# Patient Record
Sex: Male | Born: 1955 | Race: White | Hispanic: No | Marital: Married | Smoking: Former smoker
Health system: Southern US, Community
[De-identification: ages and names within clinical notes are randomized; demographics above are authoritative.]

## PROBLEM LIST (undated history)

## (undated) DIAGNOSIS — R011 Cardiac murmur, unspecified: Secondary | ICD-10-CM

## (undated) DIAGNOSIS — E119 Type 2 diabetes mellitus without complications: Secondary | ICD-10-CM

## (undated) DIAGNOSIS — I219 Acute myocardial infarction, unspecified: Secondary | ICD-10-CM

## (undated) DIAGNOSIS — I519 Heart disease, unspecified: Secondary | ICD-10-CM

## (undated) DIAGNOSIS — I10 Essential (primary) hypertension: Secondary | ICD-10-CM

## (undated) DIAGNOSIS — E785 Hyperlipidemia, unspecified: Secondary | ICD-10-CM

## (undated) HISTORY — DX: Acute myocardial infarction, unspecified: I21.9

## (undated) HISTORY — DX: Type 2 diabetes mellitus without complications: E11.9

## (undated) HISTORY — DX: Hyperlipidemia, unspecified: E78.5

## (undated) HISTORY — PX: CARDIAC SURGERY: SHX584

## (undated) HISTORY — DX: Essential (primary) hypertension: I10

## (undated) HISTORY — DX: Heart disease, unspecified: I51.9

## (undated) HISTORY — PX: SHOULDER ARTHROSCOPY: SHX128

## (undated) HISTORY — DX: Cardiac murmur, unspecified: R01.1

---

## 2016-12-14 ENCOUNTER — Other Ambulatory Visit: Payer: Self-pay | Admitting: Family Medicine

## 2016-12-14 ENCOUNTER — Ambulatory Visit
Admission: RE | Admit: 2016-12-14 | Discharge: 2016-12-14 | Disposition: A | Discharge status: Home / Self Care | Source: Ambulatory Visit | Attending: Family Medicine | Admitting: Family Medicine

## 2016-12-14 DIAGNOSIS — I340 Nonrheumatic mitral (valve) insufficiency: Secondary | ICD-10-CM | POA: Diagnosis not present

## 2016-12-14 DIAGNOSIS — R011 Cardiac murmur, unspecified: Secondary | ICD-10-CM

## 2016-12-14 NOTE — Progress Notes (Signed)
*  PRELIMINARY RESULTS* Echocardiogram 2D Echocardiogram has been performed.  Cristela BlueHege, Andree Heeg 12/14/2016, 11:23 AM

## 2017-06-14 ENCOUNTER — Other Ambulatory Visit: Payer: Self-pay | Admitting: Family Medicine

## 2017-06-14 DIAGNOSIS — R748 Abnormal levels of other serum enzymes: Secondary | ICD-10-CM

## 2017-06-16 ENCOUNTER — Ambulatory Visit
Admission: RE | Admit: 2017-06-16 | Discharge: 2017-06-16 | Disposition: A | Discharge status: Home / Self Care | Source: Ambulatory Visit | Attending: Family Medicine | Admitting: Family Medicine

## 2017-06-16 DIAGNOSIS — K760 Fatty (change of) liver, not elsewhere classified: Secondary | ICD-10-CM | POA: Diagnosis not present

## 2017-06-16 DIAGNOSIS — R748 Abnormal levels of other serum enzymes: Secondary | ICD-10-CM | POA: Insufficient documentation

## 2017-06-16 DIAGNOSIS — K7689 Other specified diseases of liver: Secondary | ICD-10-CM | POA: Diagnosis not present

## 2017-10-31 ENCOUNTER — Encounter: Payer: Self-pay | Admitting: Urology

## 2017-10-31 ENCOUNTER — Ambulatory Visit (INDEPENDENT_AMBULATORY_CARE_PROVIDER_SITE_OTHER): Admitting: Urology

## 2017-10-31 VITALS — BP 125/67 | HR 78 | Ht 76.0 in | Wt 324.6 lb

## 2017-10-31 DIAGNOSIS — R3129 Other microscopic hematuria: Secondary | ICD-10-CM

## 2017-10-31 LAB — MICROSCOPIC EXAMINATION: WBC UA: NONE SEEN /HPF (ref 0–5)

## 2017-10-31 LAB — URINALYSIS, COMPLETE
Bilirubin, UA: NEGATIVE
Glucose, UA: NEGATIVE
KETONES UA: NEGATIVE
LEUKOCYTES UA: NEGATIVE
NITRITE UA: NEGATIVE
SPEC GRAV UA: 1.025 (ref 1.005–1.030)
Urobilinogen, Ur: 4 mg/dL — ABNORMAL HIGH (ref 0.2–1.0)
pH, UA: 6 (ref 5.0–7.5)

## 2017-10-31 NOTE — Progress Notes (Signed)
10/31/2017 9:48 AM   Zachary Watkins 29-Aug-1955 [id number]  Referring provider: Larene Pickettondit, Bridget F, FNP [address] [place] , [place] [address]  Chief Complaint  Patient presents with  . Hematuria    HPI: [facility] and frequency.  They noted the frequency improved with better diabetic control.  He takes daily aspirin and quit smoking 12 years ago.  It appears that he wanted to have his workup closer to home.  He had an ultrasound of the kidneys in November 2018 that was normal  Patient now voids every 4-5 hours since controlling his diabetes very well with metformin.  His flow is good to reasonable.  He sometimes gets up once a night rarely.  He denies history of kidney stones or previous GU surgery.  He has no neurologic issues  Modifying factors: There are no other modifying factors  Associated signs and symptoms: There are no other associated signs and symptoms Aggravating and relieving factors: There are no other aggravating or relieving factors Severity: Moderate Duration: Persistent   PMH: Past Medical History:  Diagnosis Date  . Diabetes mellitus without complication (HCC)   . Heart attack (HCC)   . Heart disease   . Heart murmur   . Hyperlipidemia   . Hypertension     Surgical History: Past Surgical History:  Procedure Laterality Date  . CARDIAC SURGERY    . SHOULDER ARTHROSCOPY      Home Medications:  Allergies as of 10/31/2017      Reactions   Contrast Media [iodinated Diagnostic Agents] Itching      Medication List        Accurate as of 10/31/17  9:48 AM. Always use your most recent med list.          aspirin EC 81 MG tablet Take 81 mg by mouth.   clopidogrel 75 MG tablet Commonly known as:  PLAVIX clopidogrel 75 mg tablet  Take 1 tablet every day by oral route as directed for 30 days.   ezetimibe 10 MG tablet Commonly known as:  ZETIA   hydrALAZINE 25 MG tablet Commonly known as:   APRESOLINE hydralazine 25 mg tablet  Take 1 tablet every day by oral route as directed for 30 days.   hydrochlorothiazide 25 MG tablet Commonly known as:  HYDRODIURIL hydrochlorothiazide 25 mg tablet   isosorbide dinitrate 30 MG tablet Commonly known as:  ISORDIL   lisinopril 40 MG tablet Commonly known as:  PRINIVIL,ZESTRIL   metFORMIN 1000 MG tablet Commonly known as:  GLUCOPHAGE metformin 1,000 mg tablet   nitroGLYCERIN 0.4 MG/SPRAY spray Commonly known as:  NITROLINGUAL   ranitidine 150 MG tablet Commonly known as:  ZANTAC   rosuvastatin 40 MG tablet Commonly known as:  CRESTOR       Allergies:  Allergies  Allergen Reactions  . Contrast Media [Iodinated Diagnostic Agents] Itching    Family History: No family history on file.  Social History:  reports that he has quit smoking. He has never used smokeless tobacco. He reports that he drinks alcohol. He reports that he does not use drugs.  ROS: UROLOGY Frequent Urination?: No Hard to postpone urination?: No Burning/pain with urination?: No Get up at night to urinate?: Yes Leakage of urine?: No Urine stream starts and stops?: No Trouble starting stream?: No Do you have to strain to urinate?: No Blood in urine?: Yes Urinary tract infection?: No Sexually transmitted disease?: No Injury to kidneys or bladder?: No Painful  intercourse?: No Weak stream?: No Erection problems?: Yes Penile pain?: No  Gastrointestinal Nausea?: No Vomiting?: No Indigestion/heartburn?: No Diarrhea?: No Constipation?: No  Constitutional Fever: No Night sweats?: No Weight loss?: No Fatigue?: No  Skin Skin rash/lesions?: No Itching?: No  Eyes Blurred vision?: No Double vision?: No  Ears/Nose/Throat Sore throat?: No Sinus problems?: No  Hematologic/Lymphatic Swollen glands?: No Easy bruising?: No  Cardiovascular Leg swelling?: No Chest pain?: Yes  Respiratory Cough?: No Shortness of breath?:  Yes  Endocrine Excessive thirst?: No  Musculoskeletal Back pain?: Yes Joint pain?: No  Neurological Headaches?: No Dizziness?: No  Psychologic Depression?: No Anxiety?: No  Physical Exam: BP 125/67 (BP Location: Right Arm, Patient Position: Sitting, Cuff Size: Large)   Pulse 78   Ht 6\' 4"  (1.93 m)   Wt (!) 324 lb 9.6 oz (147.2 kg)   BMI 39.51 kg/m   Constitutional:  Alert and oriented, No acute distress. HEENT: [place] AT, moist mucus membranes.  Trachea midline, no masses. Cardiovascular: No clubbing, cyanosis, or edema. Respiratory: Normal respiratory effort, no increased work of breathing. GI: Abdomen is soft, nontender, nondistended, no abdominal masses GU: Male genitalia normal.  40 g benign prostate Skin: No rashes, bruises or suspicious lesions. Lymph: No cervical or inguinal adenopathy. Neurologic: Grossly intact, no focal deficits, moving all 4 extremities. Psychiatric: Normal mood and affect.  Laboratory Data: No results found for: WBC, HGB, HCT, MCV, PLT  No results found for: CREATININE  No results found for: PSA  No results found for: TESTOSTERONE  No results found for: HGBA1C  Urinalysis No results found for: COLORURINE, APPEARANCEUR, LABSPEC, PHURINE, GLUCOSEU, HGBUR, BILIRUBINUR, KETONESUR, PROTEINUR, UROBILINOGEN, NITRITE, LEUKOCYTESUR  Pertinent Imaging: None  Assessment & Plan: Today the patient has microscopic hematuria.  Workup described.  CT scan ordered.  Return for cystoscopy.  Past smoking history.  1. Microscopic hematuria  - Urinalysis, Complete   No follow-ups on file.  Martina Sinner, MD  [facility] [address] [place], [place] [address] [phone]

## 2017-11-02 ENCOUNTER — Telehealth: Payer: Self-pay | Admitting: Urology

## 2017-11-02 NOTE — Telephone Encounter (Signed)
Patient is allergic to contrast and wants to be pre-medicated for his CT scan. Can you call this into his pharmacy?  Thanks, Marcelino DusterMichelle

## 2017-11-08 ENCOUNTER — Other Ambulatory Visit: Payer: Self-pay | Admitting: Urology

## 2017-11-08 MED ORDER — DIPHENHYDRAMINE HCL 50 MG PO TABS: ORAL_TABLET | ORAL | 0 refills | Status: AC

## 2017-11-08 MED ORDER — RANITIDINE HCL 150 MG PO TABS: ORAL_TABLET | ORAL | 0 refills | Status: AC

## 2017-11-08 MED ORDER — PREDNISONE 50 MG PO TABS: ORAL_TABLET | ORAL | 0 refills | Status: AC

## 2017-11-08 NOTE — Progress Notes (Signed)
Allergy prep sent to pharmacy.

## 2017-11-08 NOTE — Telephone Encounter (Signed)
Done

## 2017-11-30 ENCOUNTER — Other Ambulatory Visit: Payer: Self-pay

## 2017-11-30 ENCOUNTER — Ambulatory Visit
Admission: RE | Admit: 2017-11-30 | Discharge: 2017-11-30 | Disposition: A | Discharge status: Home / Self Care | Source: Ambulatory Visit | Attending: Urology | Admitting: Urology

## 2017-11-30 DIAGNOSIS — N200 Calculus of kidney: Secondary | ICD-10-CM | POA: Insufficient documentation

## 2017-11-30 DIAGNOSIS — R3129 Other microscopic hematuria: Secondary | ICD-10-CM

## 2017-11-30 DIAGNOSIS — K7689 Other specified diseases of liver: Secondary | ICD-10-CM | POA: Diagnosis not present

## 2017-11-30 DIAGNOSIS — K769 Liver disease, unspecified: Secondary | ICD-10-CM

## 2017-11-30 LAB — POCT I-STAT CREATININE: Creatinine, Ser: 0.8 mg/dL (ref 0.61–1.24)

## 2017-11-30 MED ORDER — IOPAMIDOL (ISOVUE-300) INJECTION 61%
125.0000 mL | Freq: Once | INTRAVENOUS | Status: AC | PRN
  Administered 2017-11-30: 125 mL via INTRAVENOUS

## 2017-11-30 NOTE — Progress Notes (Signed)
Per Dr. Sherron MondayMacDiarmid patient was notified that CTscan showed changes to the liver and radiology recommends an MRI of the abd for further evaluation. Will discuss results at upcoming cysto apt

## 2017-12-05 ENCOUNTER — Other Ambulatory Visit: Admitting: Urology

## 2017-12-28 ENCOUNTER — Other Ambulatory Visit: Payer: Self-pay

## 2017-12-28 ENCOUNTER — Emergency Department

## 2017-12-28 ENCOUNTER — Emergency Department
Admission: EM | Admit: 2017-12-28 | Discharge: 2017-12-28 | Disposition: A | Discharge status: Home / Self Care | Attending: Emergency Medicine | Admitting: Emergency Medicine

## 2017-12-28 ENCOUNTER — Encounter: Payer: Self-pay | Admitting: Emergency Medicine

## 2017-12-28 DIAGNOSIS — Z79899 Other long term (current) drug therapy: Secondary | ICD-10-CM | POA: Diagnosis not present

## 2017-12-28 DIAGNOSIS — Z7982 Long term (current) use of aspirin: Secondary | ICD-10-CM | POA: Insufficient documentation

## 2017-12-28 DIAGNOSIS — Z7984 Long term (current) use of oral hypoglycemic drugs: Secondary | ICD-10-CM | POA: Diagnosis not present

## 2017-12-28 DIAGNOSIS — I10 Essential (primary) hypertension: Secondary | ICD-10-CM | POA: Insufficient documentation

## 2017-12-28 DIAGNOSIS — Z87891 Personal history of nicotine dependence: Secondary | ICD-10-CM | POA: Insufficient documentation

## 2017-12-28 DIAGNOSIS — Z7902 Long term (current) use of antithrombotics/antiplatelets: Secondary | ICD-10-CM | POA: Diagnosis not present

## 2017-12-28 DIAGNOSIS — E119 Type 2 diabetes mellitus without complications: Secondary | ICD-10-CM | POA: Diagnosis not present

## 2017-12-28 DIAGNOSIS — R42 Dizziness and giddiness: Secondary | ICD-10-CM

## 2017-12-28 LAB — BASIC METABOLIC PANEL
ANION GAP: 6 (ref 5–15)
BUN: 13 mg/dL (ref 6–20)
CHLORIDE: 107 mmol/L (ref 101–111)
CO2: 28 mmol/L (ref 22–32)
Calcium: 9.2 mg/dL (ref 8.9–10.3)
Creatinine, Ser: 0.88 mg/dL (ref 0.61–1.24)
GFR calc non Af Amer: >60 mL/min (ref >60)
GLUCOSE: 201 mg/dL — AB (ref 65–99)
Potassium: 3.4 mmol/L — ABNORMAL LOW (ref 3.5–5.1)
Sodium: 141 mmol/L (ref 135–145)

## 2017-12-28 LAB — CBC
HCT: 40.3 % (ref 40.0–52.0)
HEMOGLOBIN: 13.8 g/dL (ref 13.0–18.0)
MCH: 28.7 pg (ref 26.0–34.0)
MCHC: 34.3 g/dL (ref 32.0–36.0)
MCV: 83.7 fL (ref 80.0–100.0)
Platelets: 213 10*3/uL (ref 150–440)
RBC: 4.82 MIL/uL (ref 4.40–5.90)
RDW: 14.4 % (ref 11.5–14.5)
WBC: 7.1 10*3/uL (ref 3.8–10.6)

## 2017-12-28 LAB — URINALYSIS, COMPLETE (UACMP) WITH MICROSCOPIC
BACTERIA UA: NONE SEEN
Bilirubin Urine: NEGATIVE
GLUCOSE, UA: NEGATIVE mg/dL
KETONES UR: NEGATIVE mg/dL
Leukocytes, UA: NEGATIVE
Nitrite: NEGATIVE
Protein, ur: NEGATIVE mg/dL
SQUAMOUS EPITHELIAL / LPF: NONE SEEN (ref 0–5)
Specific Gravity, Urine: 1.016 (ref 1.005–1.030)
pH: 6 (ref 5.0–8.0)

## 2017-12-28 LAB — TROPONIN I
Troponin I: 0.03 ng/mL (ref <0.03)
Troponin I: <0.03 ng/mL (ref <0.03)

## 2017-12-28 MED ORDER — MECLIZINE HCL 25 MG PO TABS
25.0000 mg | ORAL_TABLET | Freq: Once | ORAL | Status: AC
  Administered 2017-12-28: 25 mg via ORAL
  Filled 2017-12-28: qty 1

## 2017-12-28 MED ORDER — SODIUM CHLORIDE 0.9 % IV BOLUS
1000.0000 mL | Freq: Once | INTRAVENOUS | Status: AC
  Administered 2017-12-28: 1000 mL via INTRAVENOUS

## 2017-12-28 MED ORDER — MECLIZINE HCL 25 MG PO CHEW: 25.0000 mg | CHEWABLE_TABLET | Freq: Three times a day (TID) | ORAL | 0 refills | Status: AC | PRN

## 2017-12-28 NOTE — ED Notes (Signed)
Patient transported to CT 

## 2017-12-28 NOTE — ED Notes (Signed)
Pt ambulatory in hall without dizziness.  EDP aware.

## 2017-12-28 NOTE — ED Provider Notes (Signed)
[facility] Emergency Department Provider Note  ____________________________________________  Time seen: Approximately 3:29 PM  I have reviewed the triage vital signs and the nursing notes.   HISTORY  Chief Complaint Dizziness   HPI Zachary Watkins is a 62 y.o. male with history of CAD, hypertension, hyperlipidemia, diabetes who presents for evaluation of vertigo.  Patient reports sudden onset of vertigo yesterday. Initially it was mild however today the episodes became more pronounced and more severe.  He reports sudden onset of intermittent vertigo with movement of his head associated with nausea.  Vertigo resolves when he is at rest.  No slurred speech, facial droop, unilateral weakness or numbness, headache, chest pain, shortness of breath, no trauma, no recent illness.  Patient denies prior history of vertigo.  He is not a smoker.  No personal family history of stroke.   Past Medical History:  Diagnosis Date  . Diabetes mellitus without complication (HCC)   . Heart attack (HCC)   . Heart disease   . Heart murmur   . Hyperlipidemia   . Hypertension      Past Surgical History:  Procedure Laterality Date  . CARDIAC SURGERY    . SHOULDER ARTHROSCOPY      Prior to Admission medications   Medication Sig Start Date End Date Taking? Authorizing Provider  aspirin EC 81 MG tablet Take 81 mg by mouth.    [provider]  clopidogrel (PLAVIX) 75 MG tablet clopidogrel 75 mg tablet  Take 1 tablet every day by oral route as directed for 30 days.    [provider]  diphenhydrAMINE (BENADRYL) 50 MG tablet Take one tablet one hour prior to CTU 11/08/17   McGowan, Carollee Herter A, PA-C  ezetimibe (ZETIA) 10 MG tablet  10/28/17   [provider]  hydrALAZINE (APRESOLINE) 25 MG tablet hydralazine 25 mg tablet  Take 1 tablet every day by oral route as directed for 30 days. 08/09/17   [provider]  hydrochlorothiazide (HYDRODIURIL) 25  MG tablet hydrochlorothiazide 25 mg tablet    [provider]  isosorbide dinitrate (ISORDIL) 30 MG tablet  10/25/17   [provider]  lisinopril (PRINIVIL,ZESTRIL) 40 MG tablet  09/20/17   [provider]  Meclizine HCl 25 MG CHEW Chew 1 tablet (25 mg total) by mouth 3 (three) times daily as needed. 12/28/17   Nita Sickle, MD  metFORMIN (GLUCOPHAGE) 1000 MG tablet metformin 1,000 mg tablet 07/21/17   [provider]  nitroGLYCERIN (NITROLINGUAL) 0.4 MG/SPRAY spray  09/02/17   [provider]  predniSONE (DELTASONE) 50 MG tablet Take the one tablet 13 hours, 7 hours and 1 hours prior to the CT Urogram 11/08/17   McGowan, Carollee Herter A, PA-C  ranitidine (ZANTAC) 150 MG tablet Take one tablet 1 hour prior to CT Urogram 11/08/17   McGowan, Carollee Herter A, PA-C  rosuvastatin (CRESTOR) 40 MG tablet  10/25/17   [provider]    Allergies Contrast media [iodinated diagnostic agents]  No family history on file.  Social History Social History   Tobacco Use  . Smoking status: Former Games developer  . Smokeless tobacco: Never Used  Substance Use Topics  . Alcohol use: Yes  . Drug use: Never    Review of Systems  Constitutional: Negative for fever. Eyes: Negative for visual changes. ENT: Negative for sore throat. Neck: No neck pain  Cardiovascular: Negative for chest pain. Respiratory: Negative for shortness of breath. Gastrointestinal: Negative for abdominal pain, vomiting or diarrhea. Genitourinary: Negative for  dysuria. Musculoskeletal: Negative for back pain. Skin: Negative for rash. Neurological: Negative for headaches, weakness or numbness. + vertigo Psych: No SI or HI  ____________________________________________   PHYSICAL EXAM:  VITAL SIGNS: ED Triage Vitals  Enc Vitals Group     BP 12/28/17 1046 (!) 149/78     Pulse Rate 12/28/17 1046 65     Resp 12/28/17 1046 20     Temp 12/28/17 1046 98.4 F (36.9 C)     Temp Source 12/28/17  1046 Oral     SpO2 12/28/17 1046 96 %     Weight 12/28/17 1046 (!) 315 lb (142.9 kg)     Height 12/28/17 1046  (1.93 m)     Head Circumference --      Peak Flow --      Pain Score 12/28/17 1053 0     Pain Loc --      Pain Edu? --      Excl. in GC? --     Constitutional: Alert and oriented. Well appearing and in no apparent distress. HEENT:      Head: Normocephalic and atraumatic.         Eyes: Conjunctivae are normal. Sclera is non-icteric.       Mouth/Throat: Mucous membranes are moist.       Neck: Supple with no signs of meningismus. Cardiovascular: Regular rate and rhythm. II/VI systolic murmur loudest at the apex of the lung.  Respiratory: Normal respiratory effort. Lungs are clear to auscultation bilaterally. No wheezes, crackles, or rhonchi.  Gastrointestinal: Soft, non tender, and non distended with positive bowel sounds. No rebound or guarding. Musculoskeletal: Nontender with normal range of motion in all extremities. No edema, cyanosis, or erythema of extremities. Neurologic: Normal speech and language. A & O x3, PERRL, EOMI, no nystagmus, CN II-XII intact, motor testing reveals good tone and bulk throughout. There is no evidence of pronator drift or dysmetria. Muscle strength is 5/5 throughout. Deep tendon reflexes are 2+ throughout with downgoing toes. Sensory examination is intact. Slight imbalance with ambulation but able to ambulate and no ataxia or truncal ataxia. HINTS exam negative. Skin: Skin is warm, dry and intact. No rash noted. Psychiatric: Mood and affect are normal. Speech and behavior are normal.  ____________________________________________   LABS (all labs ordered are listed, but only abnormal results are displayed)  Labs Reviewed  BASIC METABOLIC PANEL - Abnormal; Notable for the following components:      Result Value   Potassium 3.4 (*)    Glucose, Bld 201 (*)    All other components within normal limits  URINALYSIS, COMPLETE (UACMP) WITH  MICROSCOPIC - Abnormal; Notable for the following components:   Color, Urine YELLOW (*)    APPearance CLEAR (*)    Hgb urine dipstick SMALL (*)    All other components within normal limits  TROPONIN I - Abnormal; Notable for the following components:   Troponin I 0.03 (*)    All other components within normal limits  CBC  TROPONIN I   ____________________________________________  EKG  ED ECG REPORT I, Nita Sickle, the attending physician, personally viewed and interpreted this ECG.  Normal sinus rhythm, rate of 71, normal intervals, normal axis, no ST elevations or depressions, T wave flattening in inferior leads.  No prior for comparison ____________________________________________  RADIOLOGY  I have personally reviewed the images performed during this visit and I agree with the Radiologist's read.   Interpretation by Radiologist:  Ct Head Wo Contrast  Result Date: 12/28/2017 CLINICAL  DATA:  Dizziness. EXAM: CT HEAD WITHOUT CONTRAST TECHNIQUE: Contiguous axial images were obtained from the base of the skull through the vertex without intravenous contrast. COMPARISON:  None. FINDINGS: Brain: No evidence of acute infarction, hemorrhage, hydrocephalus, extra-axial collection or mass lesion/mass effect. Vascular: No hyperdense vessel or unexpected calcification. Skull: Normal. Negative for fracture or focal lesion. Sinuses/Orbits: No acute finding. Other: None. IMPRESSION: Normal head CT. Electronically Signed   By: Lupita Raider, M.D.   On: 12/28/2017 13:53     ____________________________________________   PROCEDURES  Procedure(s) performed: None Procedures Critical Care performed:  None ____________________________________________   INITIAL IMPRESSION / ASSESSMENT AND PLAN / ED COURSE   63 y.o. male with history of CAD, hypertension, hyperlipidemia, diabetes who presents for evaluation of vertigo.  Patient's vertigo is severe, positional, and intermittent.  He is  completely neurologically intact other than mild imbalance with ambulation, no truncal ataxia.  Patient underwent a head CT which was negative and basic labs which were all within normal limits.  EKG with no evidence of ischemia or dysrhythmias.  After 25 mg of meclizine and a bolus of normal saline patient's symptoms have fully resolved and he is able to ambulate with no difficulties.  No longer having vertigo.  Presentation concerning for peripheral vertigo.  Patient will be given a prescription for meclizine and refer to [facility].  Discussed return precautions for any signs of stroke or chest pain.     As part of my medical decision making, I reviewed the following data within the electronic MEDICAL RECORD NUMBER Nursing notes reviewed and incorporated, Labs reviewed , EKG interpreted , Old chart reviewed, Radiograph reviewed , Notes from prior ED visits and [place] Controlled Substance Database    Pertinent labs & imaging results that were available during my care of the patient were reviewed by me and considered in my medical decision making (see chart for details).    ____________________________________________   FINAL CLINICAL IMPRESSION(S) / ED DIAGNOSES  Final diagnoses:  Vertigo      NEW MEDICATIONS STARTED DURING THIS VISIT:  ED Discharge Orders        Ordered    Meclizine HCl 25 MG CHEW  3 times daily PRN     12/28/17 1527       Note:  This document was prepared using Dragon voice recognition software and may include unintentional dictation errors.    Don Perking, [place], MD 12/28/17 1539

## 2017-12-28 NOTE — ED Triage Notes (Signed)
Says started yesterday feeling dizzy. Today it is worse, and feels dizzy especially with movment

## 2017-12-28 NOTE — ED Notes (Signed)
Date and time results received: 12/28/17 1230 (use smartphrase ".now" to insert current time)  Test: tropnoni Critical Value: 0.03  Name of Provider Notified: tiffany charge RN, dr Sharma Covert

## 2018-01-06 ENCOUNTER — Ambulatory Visit

## 2018-01-09 ENCOUNTER — Other Ambulatory Visit

## 2018-01-13 ENCOUNTER — Ambulatory Visit
Admission: RE | Admit: 2018-01-13 | Discharge: 2018-01-13 | Disposition: A | Discharge status: Home / Self Care | Source: Ambulatory Visit | Attending: Urology | Admitting: Urology

## 2018-01-13 DIAGNOSIS — K7460 Unspecified cirrhosis of liver: Secondary | ICD-10-CM | POA: Diagnosis not present

## 2018-01-13 DIAGNOSIS — K766 Portal hypertension: Secondary | ICD-10-CM | POA: Insufficient documentation

## 2018-01-13 DIAGNOSIS — K769 Liver disease, unspecified: Secondary | ICD-10-CM | POA: Diagnosis present

## 2018-01-13 DIAGNOSIS — K7689 Other specified diseases of liver: Secondary | ICD-10-CM | POA: Insufficient documentation

## 2018-01-13 MED ORDER — GADOBENATE DIMEGLUMINE 529 MG/ML IV SOLN
20.0000 mL | Freq: Once | INTRAVENOUS | Status: AC | PRN
  Administered 2018-01-13: 20 mL via INTRAVENOUS

## 2018-02-06 ENCOUNTER — Ambulatory Visit (INDEPENDENT_AMBULATORY_CARE_PROVIDER_SITE_OTHER): Admitting: Urology

## 2018-02-06 ENCOUNTER — Encounter: Payer: Self-pay | Admitting: Urology

## 2018-02-06 VITALS — BP 133/68 | HR 67 | Ht 76.0 in | Wt 323.4 lb

## 2018-02-06 DIAGNOSIS — R3129 Other microscopic hematuria: Secondary | ICD-10-CM | POA: Diagnosis not present

## 2018-02-06 LAB — MICROSCOPIC EXAMINATION: WBC UA: NONE SEEN /HPF (ref 0–5)

## 2018-02-06 LAB — URINALYSIS, COMPLETE
BILIRUBIN UA: NEGATIVE
Glucose, UA: NEGATIVE
KETONES UA: NEGATIVE
Leukocytes, UA: NEGATIVE
NITRITE UA: NEGATIVE
SPEC GRAV UA: 1.015 (ref 1.005–1.030)
UUROB: 4 mg/dL — AB (ref 0.2–1.0)
pH, UA: 7 (ref 5.0–7.5)

## 2018-02-06 MED ORDER — LIDOCAINE HCL URETHRAL/MUCOSAL 2 % EX GEL
1.0000 "application " | Freq: Once | CUTANEOUS | Status: AC
  Administered 2018-02-06: 1 via URETHRAL

## 2018-02-06 MED ORDER — CIPROFLOXACIN HCL 500 MG PO TABS
500.0000 mg | ORAL_TABLET | Freq: Once | ORAL | Status: AC
  Administered 2018-02-06: 500 mg via ORAL

## 2018-02-06 NOTE — Progress Notes (Signed)
02/06/2018 10:07 AM   Guy BeginWalter R Duma 11-27-1955 [id number]  Referring provider: Larene Pickettondit, Bridget F, FNP [address] [place] , [place] [address]  Chief Complaint  Patient presents with  . Cysto    HPI: The [facility] was [facility] microscopic hematuria and frequency.  They noted the frequency improved with better diabetic control.  He takes daily aspirin and quit smoking 12 years ago.  It appears that he wanted to have his workup closer to home.  He had an ultrasound of the kidneys in November 2018 that was normal  Patient now voids every 4-5 hours since controlling his diabetes very well with metformin.  His flow is good to reasonable.  He sometimes gets up once a night rarely.  She had a negative CT scan.  He needed an MRI of the liver and it was within normal limits with no malignancy  Frequency is stable.  No gross hematuria.  Clinically not infected  Cystoscopy: Utilizing sterile technique and after consent patient underwent flexible cystoscopy.  The penile bulbar urethra normal.  He bilobar enlargement of the prostate.  He a little bit of a high riding bladder neck.  He had friable blood vessels in the prostatic urethra.  He had grade 1-4 bladder trabeculation.  Trigone was normal.  There were no mucosal abnormalities.  There is no carcinoma or cystitis     PMH: Past Medical History:  Diagnosis Date  . Diabetes mellitus without complication (HCC)   . Heart attack (HCC)   . Heart disease   . Heart murmur   . Hyperlipidemia   . Hypertension     Surgical History: Past Surgical History:  Procedure Laterality Date  . CARDIAC SURGERY    . SHOULDER ARTHROSCOPY      Home Medications:  Allergies as of 02/06/2018      Reactions   Contrast Media [iodinated Diagnostic Agents] Itching      Medication List        Accurate as of 02/06/18 10:07 AM. Always use your most recent med list.          aspirin EC 81 MG tablet Take 81 mg by mouth.   clopidogrel  75 MG tablet Commonly known as:  PLAVIX clopidogrel 75 mg tablet  Take 1 tablet every day by oral route as directed for 30 days.   diphenhydrAMINE 50 MG tablet Commonly known as:  BENADRYL Take one tablet one hour prior to CTU   ezetimibe 10 MG tablet Commonly known as:  ZETIA   hydrALAZINE 25 MG tablet Commonly known as:  APRESOLINE hydralazine 25 mg tablet  Take 1 tablet every day by oral route as directed for 30 days.   hydrochlorothiazide 25 MG tablet Commonly known as:  HYDRODIURIL hydrochlorothiazide 25 mg tablet   isosorbide dinitrate 30 MG tablet Commonly known as:  ISORDIL   lisinopril 40 MG tablet Commonly known as:  PRINIVIL,ZESTRIL   Meclizine HCl 25 MG Chew Chew 1 tablet (25 mg total) by mouth 3 (three) times daily as needed.   metFORMIN 1000 MG tablet Commonly known as:  GLUCOPHAGE metformin 1,000 mg tablet   nitroGLYCERIN 0.4 MG/SPRAY spray Commonly known as:  NITROLINGUAL   predniSONE 50 MG tablet Commonly known as:  DELTASONE Take the one tablet 13 hours, 7 hours and 1 hours prior to the CT Urogram   ranitidine 150 MG tablet Commonly known as:  ZANTAC Take one tablet 1 hour prior to CT Urogram   rosuvastatin 40 MG tablet  Commonly known as:  CRESTOR       Allergies:  Allergies  Allergen Reactions  . Contrast Media [Iodinated Diagnostic Agents] Itching    Family History: No family history on file.  Social History:  reports that he has quit smoking. He has never used smokeless tobacco. He reports that he drinks alcohol. He reports that he does not use drugs.  ROS:                                        Physical Exam: BP 133/68 (BP Location: Right Arm, Patient Position: Sitting, Cuff Size: Large)   Pulse 67   Ht 6\' 4"  (1.93 m)   Wt (!) 146.7 kg (323 lb 6.4 oz)   BMI 39.37 kg/m   Constitutional:  Alert and oriented, No acute distress.  Laboratory Data: Lab Results  Component Value Date   WBC 7.1  12/28/2017   HGB 13.8 12/28/2017   HCT 40.3 12/28/2017   MCV 83.7 12/28/2017   PLT 213 12/28/2017    Lab Results  Component Value Date   CREATININE 0.88 12/28/2017    No results found for: PSA  No results found for: TESTOSTERONE  No results found for: HGBA1C  Urinalysis    Component Value Date/Time   COLORURINE YELLOW (A) 12/28/2017 1309   APPEARANCEUR CLEAR (A) 12/28/2017 1309   APPEARANCEUR Clear 10/31/2017 0909   LABSPEC 1.016 12/28/2017 1309   PHURINE 6.0 12/28/2017 1309   GLUCOSEU NEGATIVE 12/28/2017 1309   HGBUR SMALL (A) 12/28/2017 1309   BILIRUBINUR NEGATIVE 12/28/2017 1309   BILIRUBINUR Negative 10/31/2017 0909   KETONESUR NEGATIVE 12/28/2017 1309   PROTEINUR NEGATIVE 12/28/2017 1309   NITRITE NEGATIVE 12/28/2017 1309   LEUKOCYTESUR NEGATIVE 12/28/2017 1309   LEUKOCYTESUR Negative 10/31/2017 0909    Pertinent Imaging: As noted above  Assessment & Plan: The patient has been cleared for microscopic hematuria.  He will seen on a as needed basis  1. Microscopic hematuria  - Urinalysis, Complete - ciprofloxacin (CIPRO) tablet 500 mg - lidocaine (XYLOCAINE) 2 % jelly 1 application   No follow-ups on file.  Martina Sinner, MD  [facility] [address], [address] [place], [place] [address] [phone]

## 2019-05-19 IMAGING — MR MR ABDOMEN WO/W CM
16 of 17 series · 43 of 48 positions shown · IV contrast (15 ML MULTIHANCE)
Comparison: CT on 11/30/2017

CLINICAL DATA: Cirrhosis and indeterminate liver lesions on recent
CT.

EXAM:
MRI ABDOMEN WITHOUT AND WITH CONTRAST
TECHNIQUE: Multiplanar multisequence MR imaging of the abdomen was performed
both before and after the administration of intravenous contrast.
CONTRAST:  20mL MULTIHANCE GADOBENATE DIMEGLUMINE 529 MG/ML IV SOLN

[Series 4: T1 · axial · 6.0mm · 0.78mm/px · z∈[-206,+111]mm · 4 of 90 slices shown]
[im 1/90]
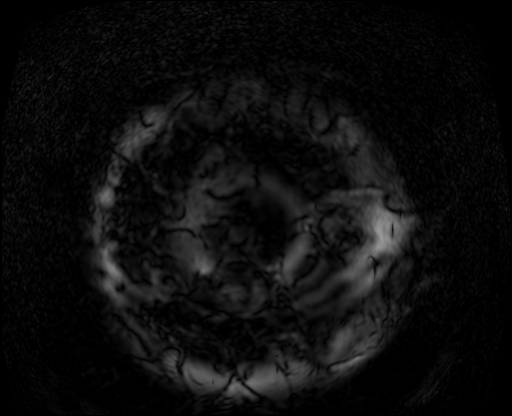
[im 30/90]
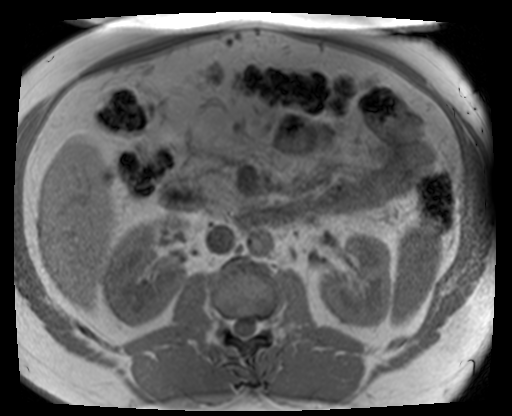
[im 60/90]
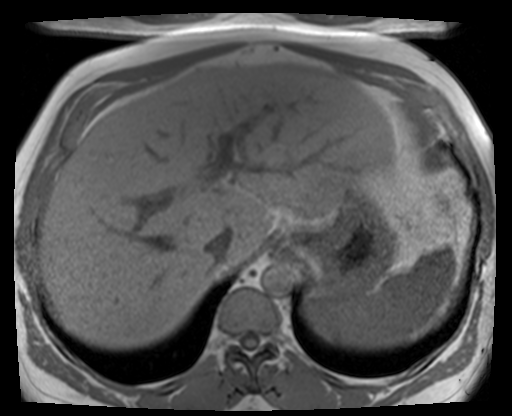
[im 90/90]
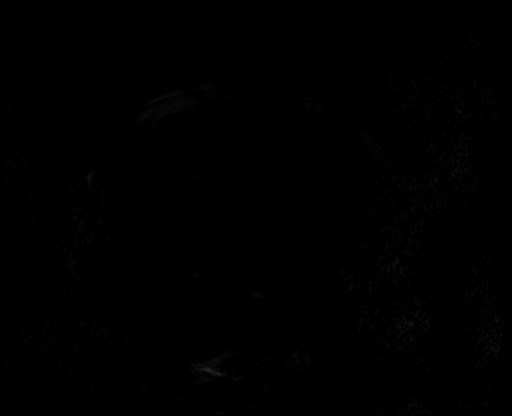

[Series 5: T2 fat-sat · axial · 6.0mm · 1.56mm/px · z∈[-206,+111]mm · 2 of 45 slices shown]
[im 1/45]
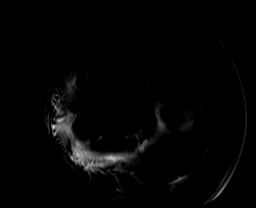
[im 45/45]
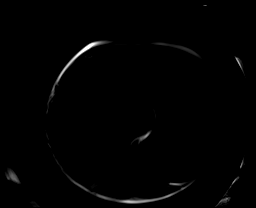

[Series 6: cor ssfse / · coronal · 7.0mm · 1.56mm/px · 3 of 50 slices shown]
[im 1/50]
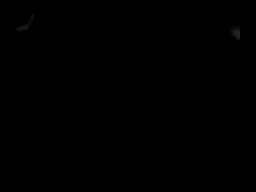
[im 25/50]
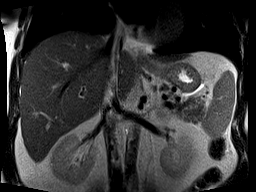
[im 50/50]
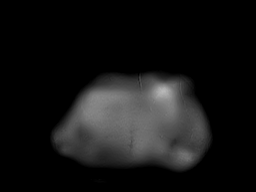

[Series 7: DWI · axial · 6.0mm · 2.08mm/px · z∈[-206,+111]mm · 4 of 133 slices shown (1 of 2)]
[im 1/133]
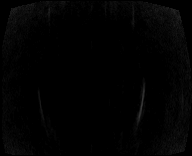
[im 45/133]
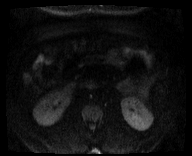
[im 89/133]
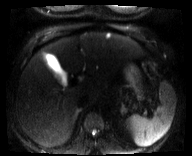
[im 133/133]
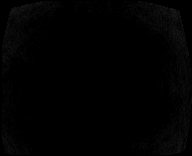

[Series 8: DWI · axial · 6.0mm · 2.08mm/px · 1 of 45 slices shown (2 of 2)]
[im 1/45]
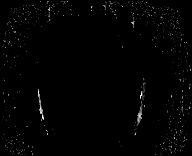

[Series 9: bSSFP · axial · 6.0mm · 0.78mm/px · 1 of 45 slices shown]
[im 1/45]
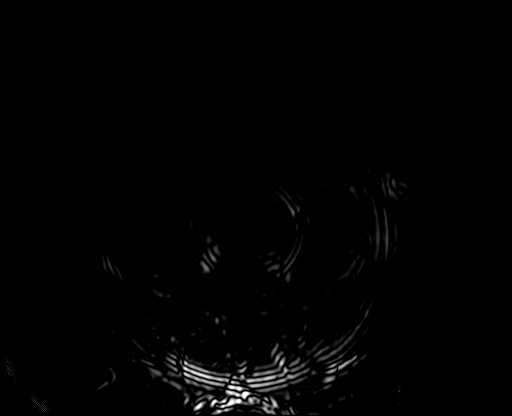

[Series 10: axial dynamic pre · axial · non-contrast · 3.0mm · 1.25mm/px · z∈[-190,+95]mm · 3 of 96 slices shown]
[im 1/96]
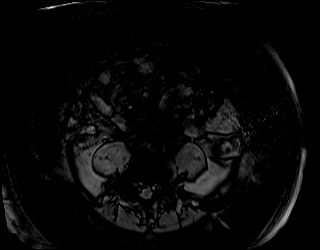
[im 48/96]
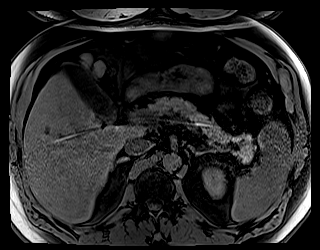
[im 96/96]
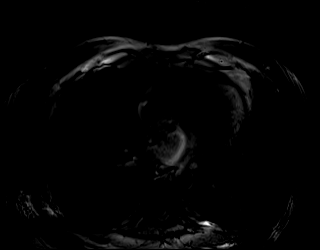

[Series 11: axial dynamic post · axial · 3.0mm · 1.25mm/px · z∈[-190,+95]mm · 3 of 96 slices shown (1 of 6)]
[im 1/96]
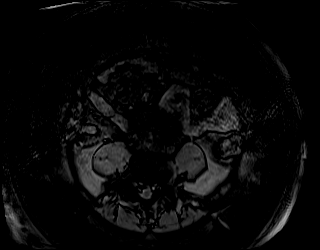
[im 48/96]
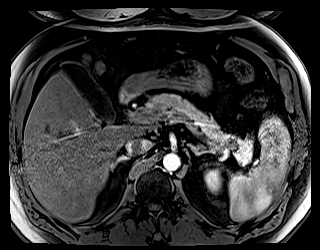
[im 96/96]
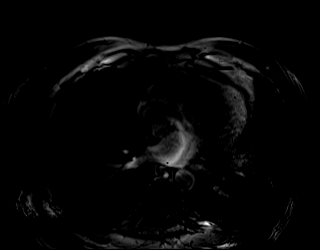

[Series 12: axial dynamic post · axial · 3.0mm · 1.25mm/px · z∈[-190,+95]mm · 3 of 96 slices shown (2 of 6)]
[im 1/96]
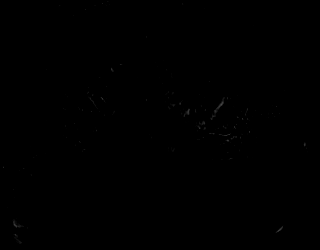
[im 48/96]
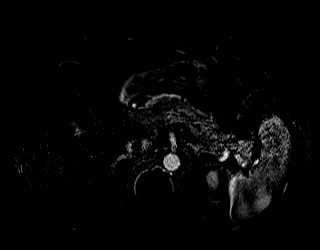
[im 96/96]
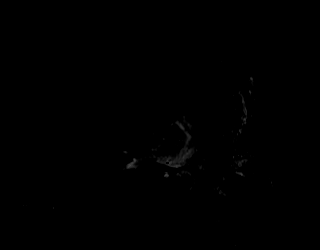

[Series 13: axial dynamic post · axial · 3.0mm · 1.25mm/px · z∈[-190,+95]mm · 3 of 96 slices shown (3 of 6)]
[im 1/96]
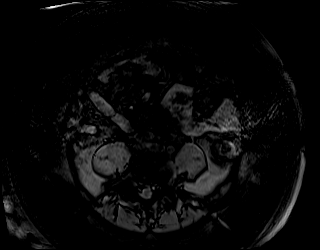
[im 48/96]
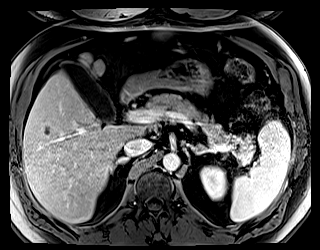
[im 96/96]
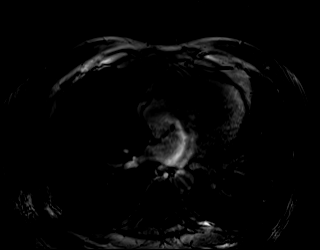

[Series 14: axial dynamic post · axial · 3.0mm · 1.25mm/px · z∈[-190,+95]mm · 3 of 96 slices shown (4 of 6)]
[im 1/96]
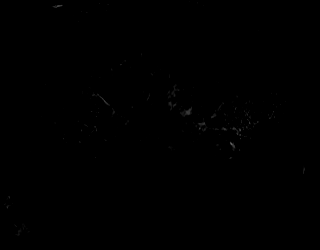
[im 48/96]
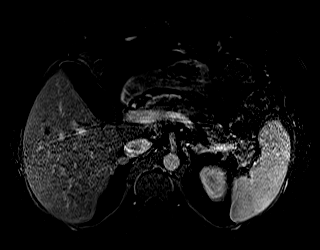
[im 96/96]
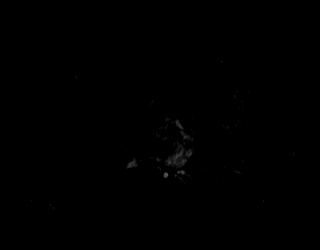

[Series 15: axial dynamic post · axial · 3.0mm · 1.25mm/px · z∈[-190,+95]mm · 3 of 96 slices shown (5 of 6)]
[im 1/96]
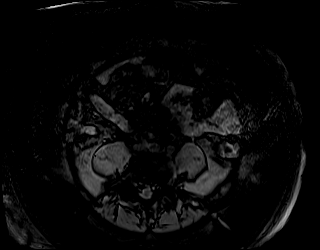
[im 48/96]
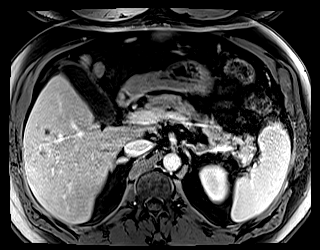
[im 96/96]
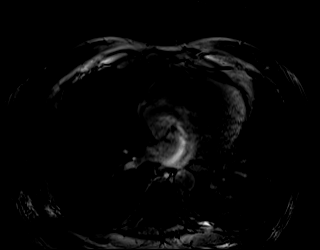

[Series 16: axial dynamic post · axial · 3.0mm · 1.25mm/px · z∈[-190,+95]mm · 3 of 96 slices shown (6 of 6)]
[im 1/96]
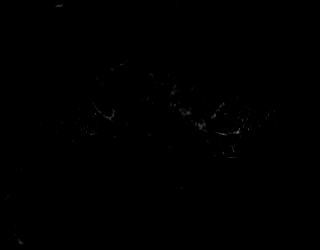
[im 48/96]
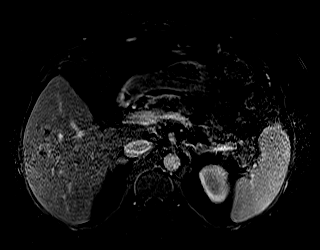
[im 96/96]
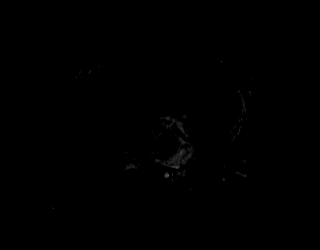

[Series 18: axial ssfse / · axial · 6.0mm · 1.25mm/px · 1 of 45 slices shown]
[im 1/45]
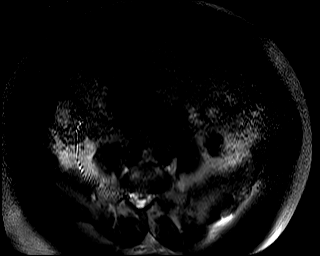

[Series 19: axial dynamic delayed · axial · 3.0mm · 1.25mm/px · z∈[-190,+95]mm · 3 of 96 slices shown]
[im 1/96]
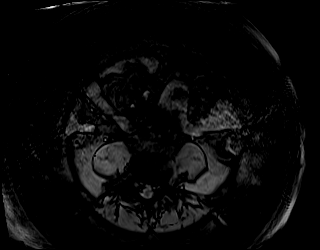
[im 48/96]
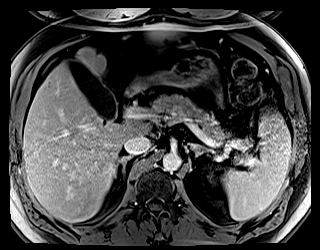
[im 96/96]
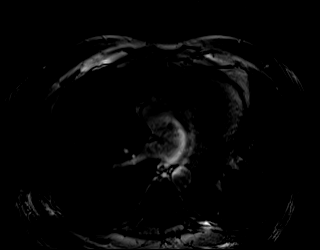

[Series 20: axial dynamic delayed_sub · axial · 3.0mm · 1.25mm/px · z∈[-190,+95]mm · 3 of 96 slices shown]
[im 1/96]
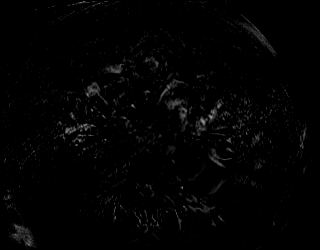
[im 48/96]
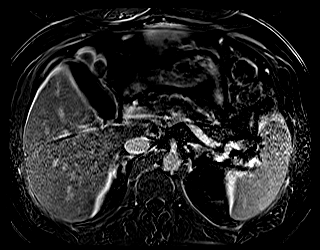
[im 96/96]
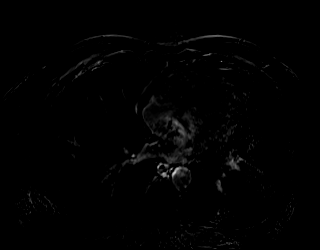

[43 of 48 positions shown; findings below may reference images not displayed]

FINDINGS: Lower chest: No acute findings.

Hepatobiliary: Hepatic cirrhosis is again demonstrated. Multiple
small less than 1 cm cysts are seen throughout the right and left
lobes, which correspond with the lesion seen on previous CT. No
solid liver masses are identified. Recanalization of paraumbilical
veins and mild esophageal varices are again seen, consistent with
pulmonary venous hypertension. Gallbladder is unremarkable. No
evidence of biliary ductal dilatation.

Pancreas:  No mass or inflammatory changes.

Spleen:  Within normal limits in size and appearance.

Adrenals/Urinary Tract: No masses identified. No evidence of
hydronephrosis.

Stomach/Bowel: Visualized portion unremarkable.

Vascular/Lymphatic: No pathologically enlarged lymph nodes
identified. No abdominal aortic aneurysm.

Other:  No evidence of ascites.

Musculoskeletal:  No suspicious bone lesions identified.
IMPRESSION: Multiple small benign hepatic cysts, which correspond with the
lesions seen on previous CT. No evidence of hepatic neoplasm.

Hepatic cirrhosis and findings of portal venous hypertension.
